# Patient Record
Sex: Female | Born: 1935 | Race: White | Hispanic: No | Marital: Married | Smoking: Never smoker
Health system: Southern US, Community
[De-identification: ages and names within clinical notes are randomized; demographics above are authoritative.]

## PROBLEM LIST (undated history)

## (undated) DIAGNOSIS — I2510 Atherosclerotic heart disease of native coronary artery without angina pectoris: Secondary | ICD-10-CM

## (undated) DIAGNOSIS — I509 Heart failure, unspecified: Secondary | ICD-10-CM

## (undated) DIAGNOSIS — I10 Essential (primary) hypertension: Secondary | ICD-10-CM

## (undated) HISTORY — DX: Atherosclerotic heart disease of native coronary artery without angina pectoris: I25.10

## (undated) HISTORY — PX: CHOLECYSTECTOMY: SHX55

## (undated) HISTORY — DX: Heart failure, unspecified: I50.9

## (undated) HISTORY — DX: Essential (primary) hypertension: I10

## (undated) HISTORY — PX: CARDIAC CATHETERIZATION: SHX172

---

## 2021-04-25 ENCOUNTER — Emergency Department (HOSPITAL_BASED_OUTPATIENT_CLINIC_OR_DEPARTMENT_OTHER)
Admission: EM | Admit: 2021-04-25 | Discharge: 2021-04-25 | Disposition: A | Discharge status: Home / Self Care | Payer: Medicare Other | Attending: Emergency Medicine | Admitting: Emergency Medicine

## 2021-04-25 ENCOUNTER — Emergency Department (HOSPITAL_BASED_OUTPATIENT_CLINIC_OR_DEPARTMENT_OTHER): Payer: Medicare Other

## 2021-04-25 ENCOUNTER — Other Ambulatory Visit: Payer: Self-pay

## 2021-04-25 ENCOUNTER — Encounter (HOSPITAL_BASED_OUTPATIENT_CLINIC_OR_DEPARTMENT_OTHER): Payer: Self-pay | Admitting: Emergency Medicine

## 2021-04-25 DIAGNOSIS — I110 Hypertensive heart disease with heart failure: Secondary | ICD-10-CM | POA: Diagnosis not present

## 2021-04-25 DIAGNOSIS — I509 Heart failure, unspecified: Secondary | ICD-10-CM | POA: Diagnosis not present

## 2021-04-25 DIAGNOSIS — W228XXA Striking against or struck by other objects, initial encounter: Secondary | ICD-10-CM | POA: Diagnosis not present

## 2021-04-25 DIAGNOSIS — S0993XA Unspecified injury of face, initial encounter: Secondary | ICD-10-CM | POA: Diagnosis present

## 2021-04-25 DIAGNOSIS — I2510 Atherosclerotic heart disease of native coronary artery without angina pectoris: Secondary | ICD-10-CM | POA: Insufficient documentation

## 2021-04-25 DIAGNOSIS — S0990XA Unspecified injury of head, initial encounter: Secondary | ICD-10-CM

## 2021-04-25 DIAGNOSIS — S0181XA Laceration without foreign body of other part of head, initial encounter: Secondary | ICD-10-CM | POA: Insufficient documentation
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
## 2021-04-25 NOTE — ED Notes (Signed)
Pt transported to CT via stretcher at this time.  

## 2021-04-25 NOTE — ED Notes (Signed)
Pt hooked up and admitted to ED cardiac monitor with BP cycling frequently. Bed in lowest position and locked. Warm blanket provided to pt. Call light within reach. No S/S of distress noted. Daughter at bedside.
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
## 2021-04-25 NOTE — Discharge Instructions (Signed)
Please read and follow all provided instructions.  Your diagnoses today include:  1. Injury of head, initial encounter   2. Laceration of forehead, initial encounter     Tests performed today include: CT scan of your head that did not show any serious injury. Vital signs. See below for your results today.   Medications prescribed:  None  Take any prescribed medications only as directed.  Home care instructions:  Follow any educational materials contained in this packet.  BE VERY CAREFUL not to take multiple medicines containing Tylenol (also called acetaminophen). Doing so can lead to an overdose which can damage your liver and cause liver failure and possibly death.   Follow-up instructions: Please follow-up with your primary care provider as needed for further evaluation of your symptoms.   Return instructions:  SEEK IMMEDIATE MEDICAL ATTENTION IF: There is confusion or drowsiness (although children frequently become drowsy after injury).  You cannot awaken the injured person.  You have more than one episode of vomiting.  You notice dizziness or unsteadiness which is getting worse, or inability to walk.  You have convulsions or unconsciousness.  You experience severe, persistent headaches not relieved by Tylenol. You cannot use arms or legs normally.  There are changes in pupil sizes. (This is the black center in the colored part of the eye)  There is clear or bloody discharge from the nose or ears.  You have change in speech, vision, swallowing, or understanding.  Localized weakness, numbness, tingling, or change in bowel or bladder control. You have any other emergent concerns.  Additional Information: You have had a head injury which does not appear to require admission at this time.  Your vital signs today were: BP (!) 183/64   Pulse (!) 59   Temp (!) 97.5 F (36.4 C) (Oral)   Resp 18   Ht 5\' 5"  (1.651 m)   Wt 72.1 kg   SpO2 100%   BMI 26.46 kg/m  If your  blood pressure (BP) was elevated above 135/85 this visit, please have this repeated by your doctor within one month. --------------

## 2022-04-25 ENCOUNTER — Other Ambulatory Visit: Payer: Self-pay | Admitting: Internal Medicine

## 2022-08-14 IMAGING — CT CT HEAD W/O CM
3 series · 15 of 47 positions shown, 18 images · non-contrast
Comparison: None.

CLINICAL DATA: Head trauma. Forehead laceration and contusion. On
Plavix.

EXAM:
CT HEAD WITHOUT CONTRAST
TECHNIQUE: Contiguous axial images were obtained from the base of the skull
through the vertex without intravenous contrast.

[Series 2: head wo · axial · 0.43mm/px · z∈[-187,-62]mm · 9 of 31 slices shown, 12 images]
[im 3/31  brain]
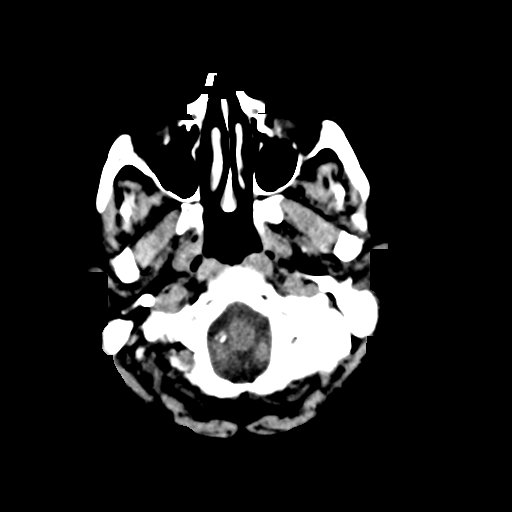
[im 3/31  bone]
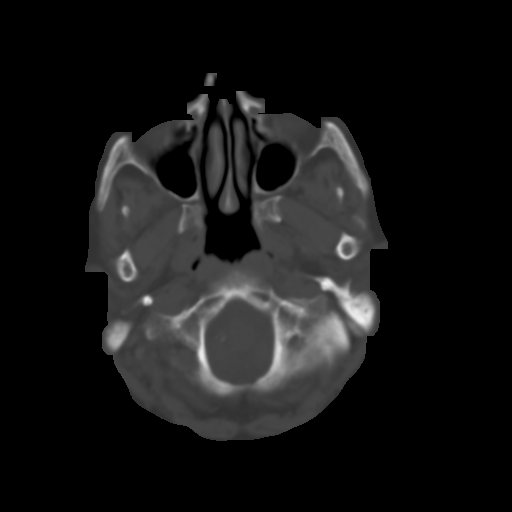
[im 6/31  brain]
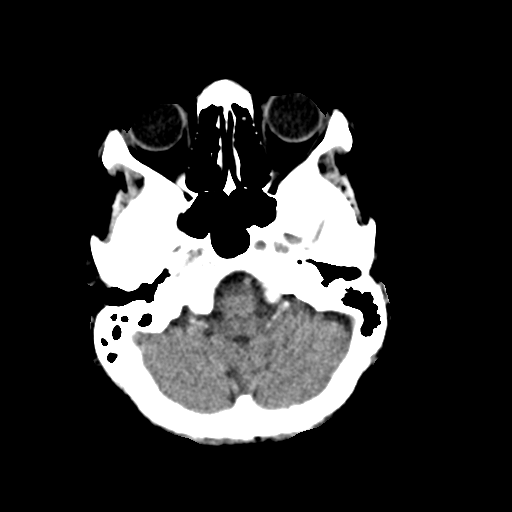
[im 9/31  brain]
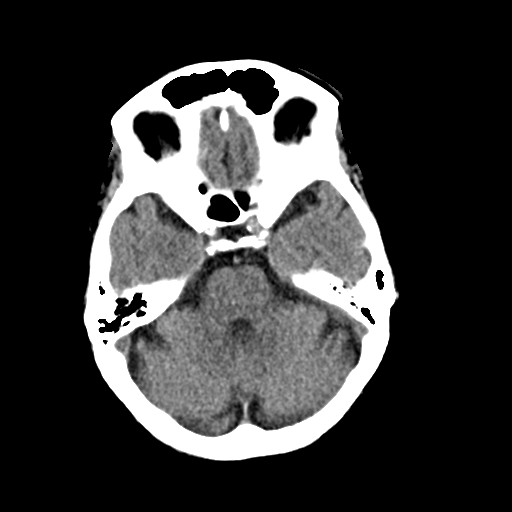
[im 12/31  brain]
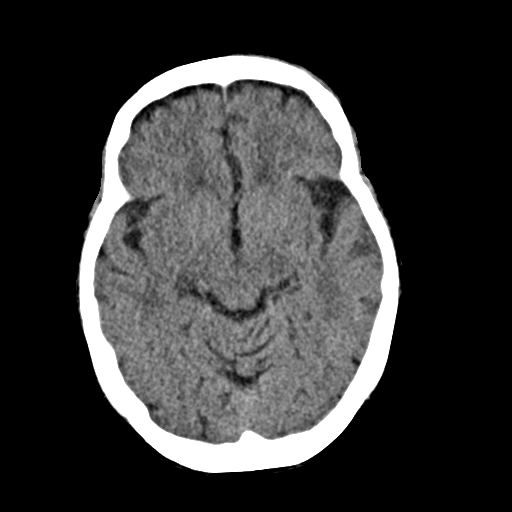
[im 16/31  brain]
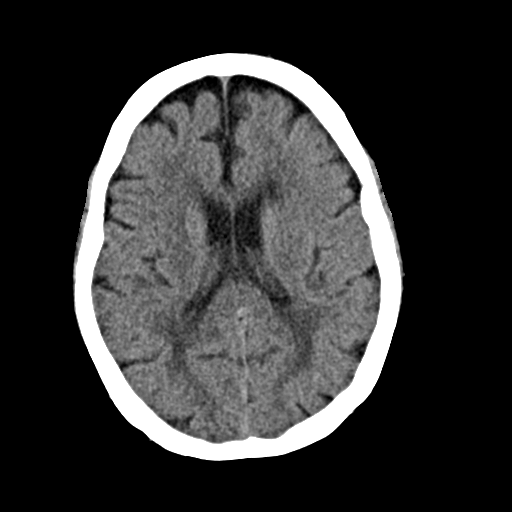
[im 16/31  bone]
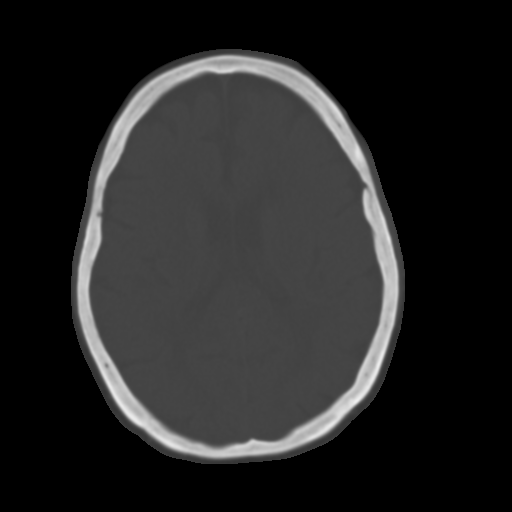
[im 19/31  brain]
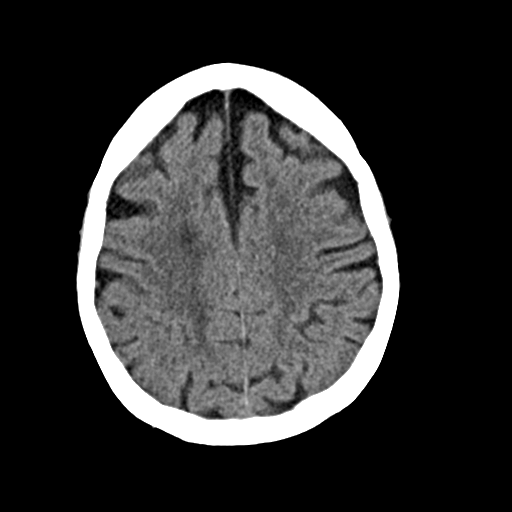
[im 22/31  brain]
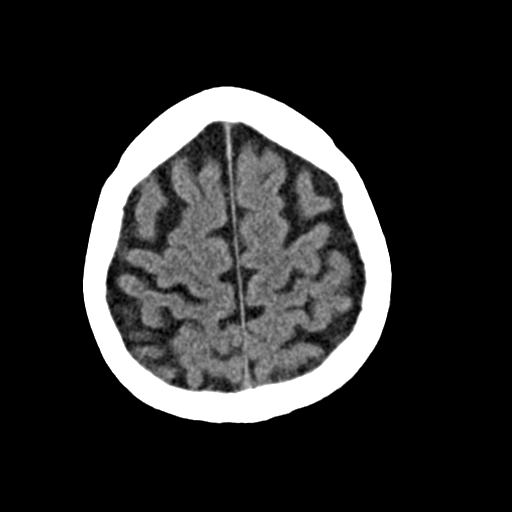
[im 25/31  brain]
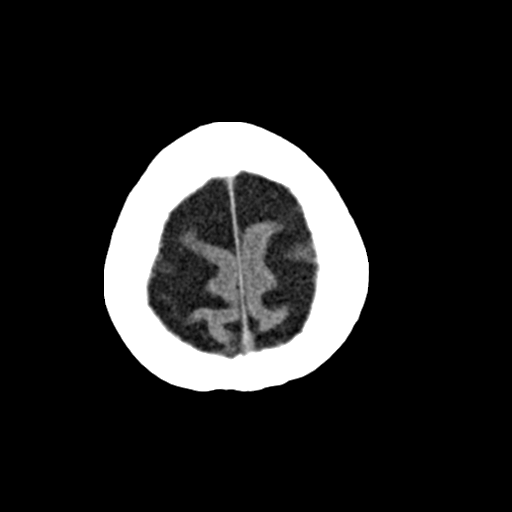
[im 28/31  brain]
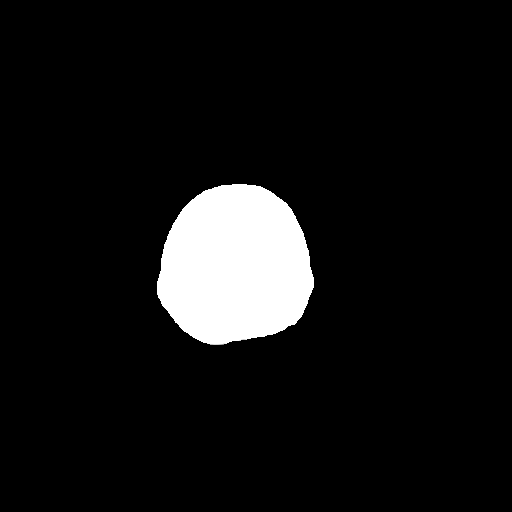
[im 28/31  bone]
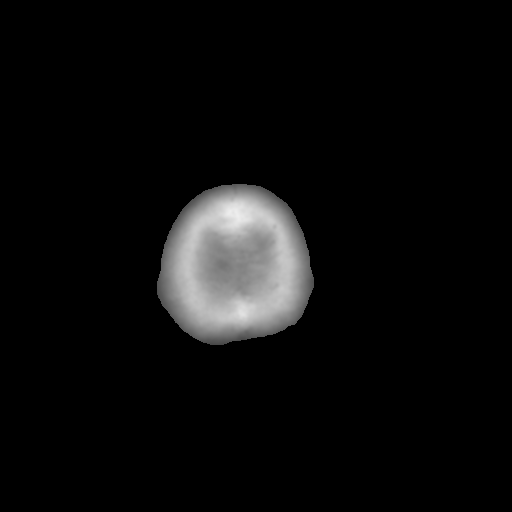

[Series 4: coronal soft · coronal · 0.31mm/px · 3 of 64 slices shown]
[im 22/64  brain]
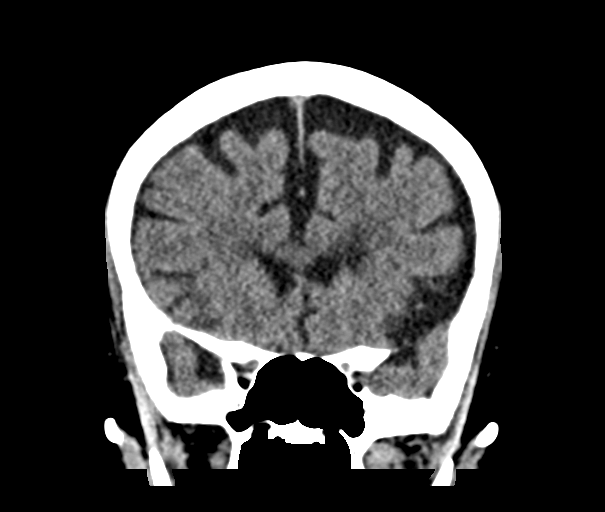
[im 29/64  brain]
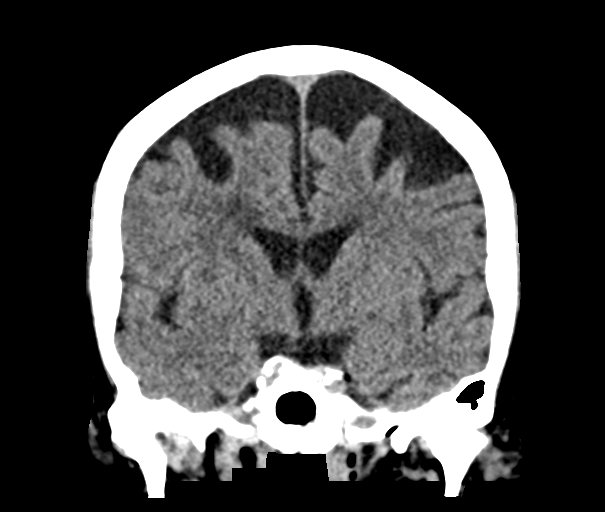
[im 36/64  brain]
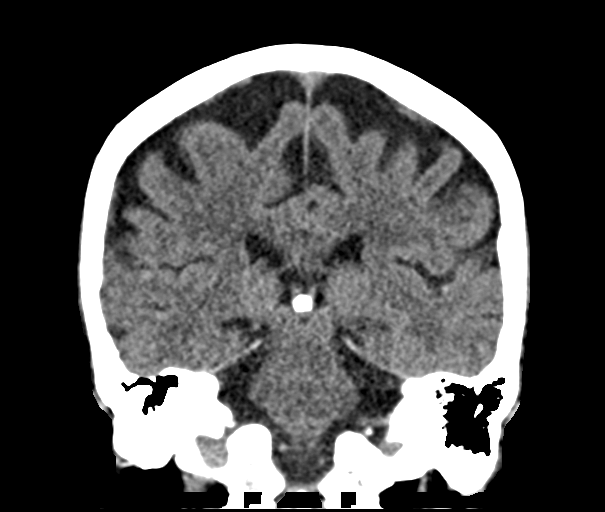

[Series 5: sag soft · sagittal · 0.31mm/px · 3 of 54 slices shown]
[im 18/54  brain]
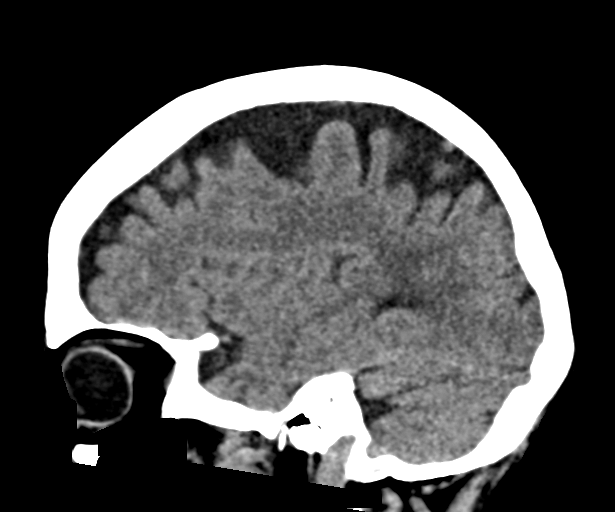
[im 27/54  brain]
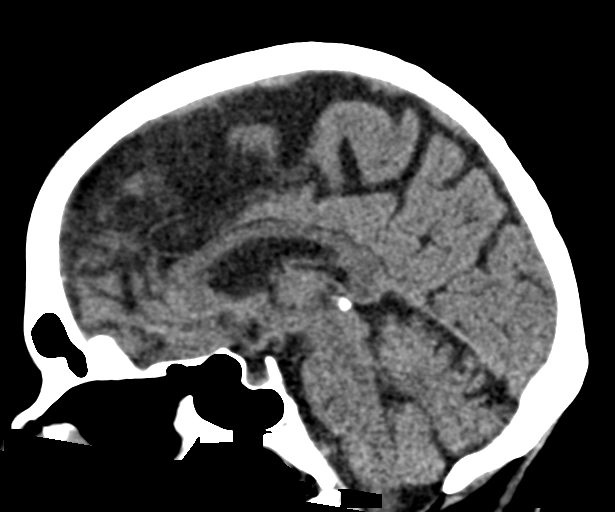
[im 36/54  brain]
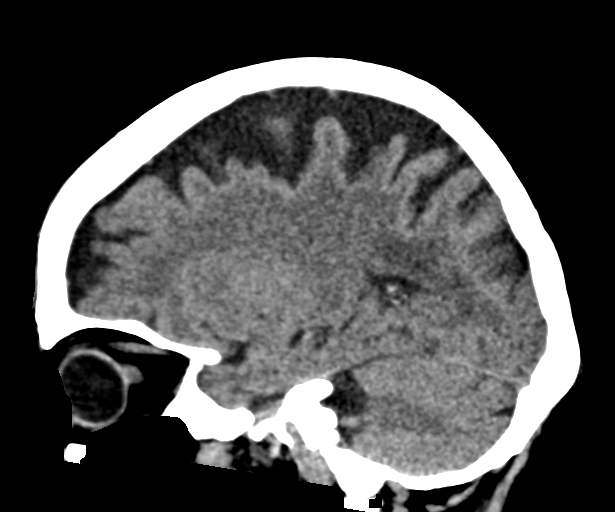

[15 of 47 positions shown; findings below may reference images not displayed]

FINDINGS: Brain: Age related atrophy. No intracranial hemorrhage, mass effect,
or midline shift. No hydrocephalus. The basilar cisterns are patent.
Moderate periventricular and deep chronic small vessel ischemia. No
evidence of territorial infarct or acute ischemia. No extra-axial or
intracranial fluid collection.

Vascular: Atherosclerosis of skullbase vasculature without
hyperdense vessel or abnormal calcification.

Skull: No fracture or focal lesion.

Sinuses/Orbits: No acute fracture. Paranasal sinuses are clear.
Bilateral cataract resection. Occasional opacification of right
mastoid air cells without significant effusion. Trace opacification
of lower left mastoid air cells.

Other: Left frontal scalp hematoma.
IMPRESSION: 1. Left frontal scalp hematoma. No acute intracranial abnormality.
No skull fracture.
2. Age related atrophy and chronic small vessel ischemia.

## 2022-11-24 ENCOUNTER — Other Ambulatory Visit: Payer: Self-pay | Admitting: Internal Medicine
